# Patient Record
Sex: Male | Born: 1985 | Race: Black or African American | Hispanic: No | Marital: Single | Smoking: Current every day smoker
Health system: Southern US, Community
[De-identification: ages and names within clinical notes are randomized; demographics above are authoritative.]

---

## 2009-11-15 ENCOUNTER — Emergency Department: Payer: Self-pay | Admitting: Emergency Medicine
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
## 2012-08-28 HISTORY — PX: OTHER SURGICAL HISTORY: SHX169

## 2014-06-01 ENCOUNTER — Emergency Department: Payer: Self-pay | Admitting: Emergency Medicine

## 2014-06-09 ENCOUNTER — Emergency Department: Payer: Self-pay | Admitting: Emergency Medicine

## 2014-06-09 LAB — COMPREHENSIVE METABOLIC PANEL
ALK PHOS: 76 U/L
Albumin: 3.5 g/dL (ref 3.4–5.0)
Anion Gap: 15 (ref 7–16)
BUN: 10 mg/dL (ref 7–18)
Bilirubin,Total: 0.2 mg/dL (ref 0.2–1.0)
CALCIUM: 8.3 mg/dL — AB (ref 8.5–10.1)
CHLORIDE: 110 mmol/L — AB (ref 98–107)
Co2: 21 mmol/L (ref 21–32)
Creatinine: 1.29 mg/dL (ref 0.60–1.30)
EGFR (Non-African Amer.): >60
GLUCOSE: 143 mg/dL — AB (ref 65–99)
Osmolality: 292 (ref 275–301)
POTASSIUM: 3.9 mmol/L (ref 3.5–5.1)
SGOT(AST): 34 U/L (ref 15–37)
SGPT (ALT): 59 U/L
Sodium: 146 mmol/L — ABNORMAL HIGH (ref 136–145)
TOTAL PROTEIN: 6.5 g/dL (ref 6.4–8.2)

## 2014-06-09 LAB — URINALYSIS, COMPLETE
BACTERIA: NONE SEEN
Bilirubin,UR: NEGATIVE
Blood: NEGATIVE
GLUCOSE, UR: NEGATIVE mg/dL (ref 0–75)
Ketone: NEGATIVE
Leukocyte Esterase: NEGATIVE
NITRITE: NEGATIVE
PH: 5 (ref 4.5–8.0)
Protein: NEGATIVE
SQUAMOUS EPITHELIAL: NONE SEEN
Specific Gravity: 1.017 (ref 1.003–1.030)
WBC UR: 2 /HPF (ref 0–5)

## 2014-06-09 LAB — CBC
HCT: 42.9 % (ref 40.0–52.0)
HGB: 13.6 g/dL (ref 13.0–18.0)
MCH: 29.6 pg (ref 26.0–34.0)
MCHC: 31.7 g/dL — AB (ref 32.0–36.0)
MCV: 94 fL (ref 80–100)
Platelet: 242 10*3/uL (ref 150–440)
RBC: 4.59 10*6/uL (ref 4.40–5.90)
RDW: 13.5 % (ref 11.5–14.5)
WBC: 13.9 10*3/uL — AB (ref 3.8–10.6)

## 2014-06-09 LAB — ETHANOL
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
## 2016-01-11 ENCOUNTER — Emergency Department
Admission: EM | Admit: 2016-01-11 | Discharge: 2016-01-11 | Disposition: A | Discharge status: Home / Self Care | Payer: Medicaid Other | Attending: Emergency Medicine | Admitting: Emergency Medicine

## 2016-01-11 ENCOUNTER — Encounter: Payer: Self-pay | Admitting: Emergency Medicine

## 2016-01-11 DIAGNOSIS — R350 Frequency of micturition: Secondary | ICD-10-CM | POA: Insufficient documentation

## 2016-01-11 DIAGNOSIS — R030 Elevated blood-pressure reading, without diagnosis of hypertension: Secondary | ICD-10-CM | POA: Diagnosis not present

## 2016-01-11 LAB — URINALYSIS COMPLETE WITH MICROSCOPIC (ARMC ONLY)
BACTERIA UA: NONE SEEN
Bilirubin Urine: NEGATIVE
GLUCOSE, UA: NEGATIVE mg/dL
Hgb urine dipstick: NEGATIVE
Ketones, ur: NEGATIVE mg/dL
Leukocytes, UA: NEGATIVE
NITRITE: NEGATIVE
Protein, ur: NEGATIVE mg/dL
RBC / HPF: NONE SEEN RBC/hpf (ref 0–5)
Specific Gravity, Urine: 1.019 (ref 1.005–1.030)
pH: 6 (ref 5.0–8.0)

## 2016-01-11 NOTE — ED Notes (Signed)
States he developed some urinary freq abotu 1 month ago  Denies any penile discharge or flank pain

## 2016-01-11 NOTE — ED Provider Notes (Signed)
[facility] Emergency Department Provider Note  ____________________________________________  Time seen: Approximately 9:22 AM  I have reviewed the triage vital signs and the nursing notes.   HISTORY  Chief Complaint Urinary Frequency    HPI Copelan A Altier is a 30 y.o. male , NAD, presents to emergency room with one-month history of increased urinary frequency. Denies any urethral discharge, dysuria, hematuria, abdominal pain, back pain, nausea, vomiting. Has not had any fevers, chills, body aches. Notes that his girlfriend has had a recurrent UTIs over the last few weeks and currently has symptoms. States he drinks soda as his only intake of fluids. Has done so first many years. Has tried to increase water intake over the last few days to see no changes in urinary frequency. Denies headaches, chest pain, back pain.   History reviewed. No pertinent past medical history.  There are no active problems to display for this patient.   History reviewed. No pertinent past surgical history.  No current outpatient prescriptions on file.  Allergies Review of patient's allergies indicates no known allergies.  No family history on file.  Social History Social History  Substance Use Topics  . Smoking status: Never Smoker   . Smokeless tobacco: None  . Alcohol Use: None     Review of Systems  Constitutional: No fever/chills, fatigue Eyes: No visual changes. Cardiovascular: No chest pain. Respiratory: No shortness of breath.  Gastrointestinal: No abdominal pain.  No nausea, vomiting.   Genitourinary: Positive increased urinary frequency. Negative for dysuria, hematuria. No urethral discharge No urinary hesitancy, urgency. Musculoskeletal: Negative for back pain.  Skin: Negative for rash, skin sores. Neurological: Negative for headaches, focal weakness or numbness. No tingling 10-point ROS otherwise  negative.  ____________________________________________   PHYSICAL EXAM:  VITAL SIGNS: ED Triage Vitals  Enc Vitals Group     BP 01/11/16 0853 158/103 mmHg     Pulse Rate 01/11/16 0853 71     Resp 01/11/16 0853 18     Temp 01/11/16 0853 97.9 F (36.6 C)     Temp Source 01/11/16 0853 Oral     SpO2 01/11/16 0853 98 %     Weight 01/11/16 0853 220 lb (99.791 kg)     Height 01/11/16 0853 5\' 9"  (1.753 m)     Head Cir --      Peak Flow --      Pain Score --      Pain Loc --      Pain Edu? --      Excl. in [place]? --      Constitutional: Alert and oriented. Well appearing and in no acute distress. Eyes: Conjunctivae are normal. Head: Atraumatic. Cardiovascular: Normal rate, regular rhythm. Normal S1 and S2.  Good peripheral circulation. Respiratory: Normal respiratory effort without tachypnea or retractions. Lungs CTAB with breath sounds noted in all lung fields. Gastrointestinal: Soft and nontender, without distention or guarding in all quadrants. No CVA tenderness. Neurologic:  Normal speech and language.  Skin:  Large vertical scar noted about the abdomen from previous surgical removal of colon. Skin is warm, dry and intact. No rash noted. Psychiatric: Mood and affect are normal. Speech and behavior are normal. Patient exhibits appropriate insight and judgement.   ____________________________________________   LABS (all labs ordered are listed, but only abnormal results are displayed)  Labs Reviewed  URINALYSIS COMPLETEWITH MICROSCOPIC ([facility] ONLY) - Abnormal; Notable for the following:    Color, Urine YELLOW (*)    APPearance CLEAR (*)  Squamous Epithelial / LPF 0-5 (*)    All other components within normal limits   ____________________________________________  EKG  None ____________________________________________  RADIOLOGY  None ____________________________________________    PROCEDURES  Procedure(s) performed: None    Medications - No data to  display   ____________________________________________   INITIAL IMPRESSION / ASSESSMENT AND PLAN / ED COURSE  Pertinent lab results that were available during my care of the patient were reviewed by me and considered in my medical decision making (see chart for details).  Patient's diagnosis is consistent with increased urinary frequency and elevated blood pressure without diagnosis of hypertension. Physical exam, vital signs and urinalysis are reassuring and without evidence of infection or kidney distress. Patient was noted to have elevated blood pressure reading and was educated on hypertension and potential effects on the kidneys and urinary output. Patient advised to decrease soda intake and increase water intake. Patient is to follow up with [facility] clinic to establish care and for recheck if symptoms persist past this treatment course. Patient is given ED precautions to return to the ED for any worsening or new symptoms.    ____________________________________________  FINAL CLINICAL IMPRESSION(S) / ED DIAGNOSES  Final diagnoses:  Increased urinary frequency  Elevated blood pressure reading without diagnosis of hypertension      NEW MEDICATIONS STARTED DURING THIS VISIT:  New Prescriptions   No medications on file         Braxton Feathers, PA-C 01/11/16 [id number]

## 2016-01-11 NOTE — ED Notes (Signed)
C/o urinary frequency.  States his girlfriend has a UTI.  NAD

## 2016-01-11 NOTE — Discharge Instructions (Signed)
Please limit intake of sodas and increase intake of water.   Urinary Frequency The number of times a normal person urinates depends upon how much liquid they take in and how much liquid they are losing. If the temperature is hot and there is high humidity, then the person will sweat more and usually breathe a little more frequently. These factors decrease the amount of frequency of urination that would be considered normal. The amount you drink is easily determined, but the amount of fluid lost is sometimes more difficult to calculate.  Fluid is lost in two ways:  Sensible fluid loss is usually measured by the amount of urine that you get rid of. Losses of fluid can also occur with diarrhea.  Insensible fluid loss is more difficult to measure. It is caused by evaporation. Insensible loss of fluid occurs through breathing and sweating. It usually ranges from a little less than a quart to a little more than a quart of fluid a day. In normal temperatures and activity levels, the average person may urinate 4 to 7 times in a 24-hour period. Needing to urinate more often than that could indicate a problem. If one urinates 4 to 7 times in 24 hours and has large volumes each time, that could indicate a different problem from one who urinates 4 to 7 times a day and has small volumes. The time of urinating is also important. Most urinating should be done during the waking hours. Getting up at night to urinate frequently can indicate some problems. CAUSES  The bladder is the organ in your lower abdomen that holds urine. Like a balloon, it swells some as it fills up. Your nerves sense this and tell you it is time to head for the bathroom. There are a number of reasons that you might feel the need to urinate more often than usual. They include:  Urinary tract infection. This is usually associated with other signs such as burning when you urinate.  In men, problems with the prostate (a walnut-size gland that is  located near the tube that carries urine out of your body). There are two reasons why the prostate can cause an increased frequency of urination:  An enlarged prostate that does not let the bladder empty well. If the bladder only half empties when you urinate, then it only has half the capacity to fill before you have to urinate again.  The nerves in the bladder become more hypersensitive with an increased size of the prostate even if the bladder empties completely.  Pregnancy.  Obesity. Excess weight is more likely to cause a problem for women than for men.  Bladder stones or other bladder problems.  Caffeine.  Alcohol.  Medications. For example, drugs that help the body get rid of extra fluid (diuretics) increase urine production. Some other medicines must be taken with lots of fluids.  Muscle or nerve weakness. This might be the result of a spinal cord injury, a stroke, multiple sclerosis, or Parkinson disease.  Long-standing diabetes can decrease the sensation of the bladder. This loss of sensation makes it harder to sense the bladder needs to be emptied. Over a period of years, the bladder is stretched out by constant overfilling. This weakens the bladder muscles so that the bladder does not empty well and has less capacity to fill with new urine.  Interstitial cystitis (also called painful bladder syndrome). This condition develops because the tissues that line the inside of the bladder are inflamed (inflammation is the body's  way of reacting to injury or infection). It causes pain and frequent urination. It occurs in women more often than in men. DIAGNOSIS   To decide what might be causing your urinary frequency, your health care provider will probably:  Ask about symptoms you have noticed.  Ask about your overall health. This will include questions about any medications you are taking.  Do a physical examination.  Order some tests. These might include:  A blood test to  check for diabetes or other health issues that could be contributing to the problem.  Urine testing. This could measure the flow of urine and the pressure on the bladder.  A test of your neurological system (the brain, spinal cord, and nerves). This is the system that senses the need to urinate.  A bladder test to check whether it is emptying completely when you urinate.  Cystoscopy. This test uses a thin tube with a tiny camera on it. It offers a look inside your urethra and bladder to see if there are problems.  Imaging tests. You might be given a contrast dye and then asked to urinate. X-rays are taken to see how your bladder is working. TREATMENT  It is important for you to be evaluated to determine if the amount or frequency that you have is unusual or abnormal. If it is found to be abnormal, the cause should be determined and this can usually be found out easily. Depending upon the cause, treatment could include medication, stimulation of the nerves, or surgery. There are not too many things that you can do as an individual to change your urinary frequency. It is important that you balance the amount of fluid intake needed to compensate for your activity and the temperature. Medical problems will be diagnosed and taken care of by your physician. There is no particular bladder training such as Kegel exercises that you can do to help urinary frequency. This is an exercise that is usually recommended for people who have leaking of urine when they laugh, cough, or sneeze. HOME CARE INSTRUCTIONS   Take any medications your health care provider prescribed or suggested. Follow the directions carefully.  Practice any lifestyle changes that are recommended. These might include:  Drinking less fluid or drinking at different times of the day. If you need to urinate often during the night, for example, you may need to stop drinking fluids early in the evening.  Cutting down on caffeine or alcohol.  They both can make you need to urinate more often than normal. Caffeine is found in coffee, tea, and sodas.  Losing weight, if that is recommended.  Keep a journal or a log. You might be asked to record how much you drink and when and where you feel the need to urinate. This will also help evaluate how well the treatment provided by your physician is working. SEEK MEDICAL CARE IF:   Your need to urinate often gets worse.  You feel increased pain or irritation when you urinate.  You notice blood in your urine.  You have questions about any medications that your health care provider recommended.  You notice blood, pus, or swelling at the site of any test or treatment procedure.  You develop a fever of more than 100.62F (38.1C). SEEK IMMEDIATE MEDICAL CARE IF:  You develop a fever of more than 102.79F (38.9C).   This information is not intended to replace advice given to you by your health care provider. Make sure you discuss any questions you have  with your health care provider.   Document Released: 06/10/2009 Document Revised: 09/04/2014 Document Reviewed: 06/10/2009 Elsevier Interactive Patient Education 2016 [place] DASH stands for "Dietary Approaches to Stop Hypertension." The DASH eating plan is a healthy eating plan that has been shown to reduce high blood pressure (hypertension). Additional health benefits may include reducing the risk of type 2 diabetes mellitus, heart disease, and stroke. The DASH eating plan may also help with weight loss. WHAT DO I NEED TO KNOW ABOUT THE DASH EATING PLAN? For the DASH eating plan, you will follow these general guidelines:  Choose foods with a percent daily value for sodium of less than 5% (as listed on the food label).  Use salt-free seasonings or herbs instead of table salt or sea salt.  Check with your health care provider or pharmacist before using salt substitutes.  Eat lower-sodium products, often labeled  as "lower sodium" or "no salt added."  Eat fresh foods.  Eat more vegetables, fruits, and low-fat dairy products.  Choose whole grains. Look for the word "whole" as the first word in the ingredient list.  Choose fish and skinless chicken or [place] more often than red meat. Limit fish, poultry, and meat to 6 oz (170 g) each day.  Limit sweets, desserts, sugars, and sugary drinks.  Choose heart-healthy fats.  Limit cheese to 1 oz (28 g) per day.  Eat more home-cooked food and less restaurant, buffet, and fast food.  Limit fried foods.  Cook foods using methods other than frying.  Limit canned vegetables. If you do use them, rinse them well to decrease the sodium.  When eating at a restaurant, ask that your food be prepared with less salt, or no salt if possible. WHAT FOODS CAN I EAT? Seek help from a dietitian for individual calorie needs. Grains Whole grain or whole wheat bread. Brown rice. Whole grain or whole wheat pasta. Quinoa, bulgur, and whole grain cereals. Low-sodium cereals. Corn or whole wheat flour tortillas. Whole grain cornbread. Whole grain crackers. Low-sodium crackers. Vegetables Fresh or frozen vegetables (raw, steamed, roasted, or grilled). Low-sodium or reduced-sodium tomato and vegetable juices. Low-sodium or reduced-sodium tomato sauce and paste. Low-sodium or reduced-sodium canned vegetables.  Fruits All fresh, canned (in natural juice), or frozen fruits. Meat and Other Protein Products Ground beef (85% or leaner), grass-fed beef, or beef trimmed of fat. Skinless chicken or [place]. Ground chicken or [place]. Pork trimmed of fat. All fish and seafood. Eggs. Dried beans, peas, or lentils. Unsalted nuts and seeds. Unsalted canned beans. Dairy Low-fat dairy products, such as skim or 1% milk, 2% or reduced-fat cheeses, low-fat ricotta or cottage cheese, or plain low-fat yogurt. Low-sodium or reduced-sodium cheeses. Fats and Oils Tub margarines without trans fats.  Light or reduced-fat mayonnaise and salad dressings (reduced sodium). Avocado. Safflower, olive, or canola oils. Natural peanut or almond butter. Other Unsalted popcorn and pretzels. The items listed above may not be a complete list of recommended foods or beverages. Contact your dietitian for more options. WHAT FOODS ARE NOT RECOMMENDED? Grains White bread. White pasta. White rice. Refined cornbread. Bagels and croissants. Crackers that contain trans fat. Vegetables Creamed or fried vegetables. Vegetables in a cheese sauce. Regular canned vegetables. Regular canned tomato sauce and paste. Regular tomato and vegetable juices. Fruits Dried fruits. Canned fruit in light or heavy syrup. Fruit juice. Meat and Other Protein Products Fatty cuts of meat. Ribs, chicken wings, bacon, sausage, bologna, salami, chitterlings, fatback, hot dogs, bratwurst, and packaged  luncheon meats. Salted nuts and seeds. Canned beans with salt. Dairy Whole or 2% milk, cream, half-and-half, and cream cheese. Whole-fat or sweetened yogurt. Full-fat cheeses or blue cheese. Nondairy creamers and whipped toppings. Processed cheese, cheese spreads, or cheese curds. Condiments Onion and garlic salt, seasoned salt, table salt, and sea salt. Canned and packaged gravies. Worcestershire sauce. Tartar sauce. Barbecue sauce. Teriyaki sauce. Soy sauce, including reduced sodium. Steak sauce. Fish sauce. Oyster sauce. Cocktail sauce. Horseradish. Ketchup and mustard. Meat flavorings and tenderizers. Bouillon cubes. Hot sauce. Tabasco sauce. Marinades. Taco seasonings. Relishes. Fats and Oils Butter, stick margarine, lard, shortening, ghee, and bacon fat. Coconut, palm kernel, or palm oils. Regular salad dressings. Other Pickles and olives. Salted popcorn and pretzels. The items listed above may not be a complete list of foods and beverages to avoid. Contact your dietitian for more information. WHERE CAN I FIND MORE  INFORMATION? National Heart, Lung, and Blood Institute: travelstabloid.com   This information is not intended to replace advice given to you by your health care provider. Make sure you discuss any questions you have with your health care provider.   Document Released: 08/03/2011 Document Revised: 09/04/2014 Document Reviewed: 06/18/2013 Elsevier Interactive Patient Education 2016 [employer].  Hypertension Hypertension is another name for high blood pressure. High blood pressure forces your heart to work harder to pump blood. A blood pressure reading has two numbers, which includes a higher number over a lower number (example: 110/72). HOME CARE   Have your blood pressure rechecked by your doctor.  Only take medicine as told by your doctor. Follow the directions carefully. The medicine does not work as well if you skip doses. Skipping doses also puts you at risk for problems.  Do not smoke.  Monitor your blood pressure at home as told by your doctor. GET HELP IF:  You think you are having a reaction to the medicine you are taking.  You have repeat headaches or feel dizzy.  You have puffiness (swelling) in your ankles.  You have trouble with your vision. GET HELP RIGHT AWAY IF:   You get a very bad headache and are confused.  You feel weak, numb, or faint.  You get chest or belly (abdominal) pain.  You throw up (vomit).  You cannot breathe very well. MAKE SURE YOU:   Understand these instructions.  Will watch your condition.  Will get help right away if you are not doing well or get worse.   This information is not intended to replace advice given to you by your health care provider. Make sure you discuss any questions you have with your health care provider.   Document Released: 01/31/2008 Document Revised: 08/19/2013 Document Reviewed: 06/06/2013 Elsevier Interactive Patient Education [employer].

## 2018-10-14 ENCOUNTER — Emergency Department
Admission: EM | Admit: 2018-10-14 | Discharge: 2018-10-14 | Disposition: A | Discharge status: Home / Self Care | Payer: Medicaid Other | Attending: Emergency Medicine | Admitting: Emergency Medicine

## 2018-10-14 ENCOUNTER — Other Ambulatory Visit: Payer: Self-pay

## 2018-10-14 ENCOUNTER — Encounter: Payer: Self-pay | Admitting: Emergency Medicine

## 2018-10-14 DIAGNOSIS — D170 Benign lipomatous neoplasm of skin and subcutaneous tissue of head, face and neck: Secondary | ICD-10-CM | POA: Diagnosis not present

## 2018-10-14 DIAGNOSIS — G501 Atypical facial pain: Secondary | ICD-10-CM | POA: Diagnosis present

## 2018-10-14 NOTE — ED Provider Notes (Signed)
[facility] Emergency Department Provider Note  ____________________________________________  Time seen: Approximately 3:18 PM  I have reviewed the triage vital signs and the nursing notes.   HISTORY  Chief Complaint [place]    HPI Aaron Montgomery is a 33 y.o. male who presents the emergency department complaining of a "lump" to the left face.  Patient reports that he had a lesion consistent with a "whitehead" approximately 3 to 4 months ago.  Patient reports that it never came to a "head" or drained any material.  Patient has then been able to palpate a lesion to this area that is been slowly enlarging over the past 3 months.  Patient denies any overlying skin changes.  No drainage.  Patient reports that now area is becoming aggravating especially while sleeping on that side.  No other complaints at this time.  No history of recurrent skin lesions.    History reviewed. No pertinent past medical history.  There are no active problems to display for this patient.   No past surgical history on file.  Prior to Admission medications   Not on File    Allergies Patient has no known allergies.  No family history on file.  Social History Social History   Tobacco Use  . Smoking status: Never Smoker  Substance Use Topics  . Alcohol use: Not on file  . Drug use: Not on file     Review of Systems  Constitutional: No fever/chills Eyes: No visual changes. No discharge ENT: No upper respiratory complaints. Cardiovascular: no chest pain. Respiratory: no cough. No SOB. Gastrointestinal: No abdominal pain.  No nausea, no vomiting.  No diarrhea.  No constipation. Musculoskeletal: Negative for musculoskeletal pain. Skin: Positive for "lump" to the left face Neurological: Negative for headaches, focal weakness or numbness. 10-point ROS otherwise negative.  ____________________________________________   PHYSICAL EXAM:  VITAL SIGNS: ED Triage Vitals  Enc  Vitals Group     BP 10/14/18 1412 (!) 164/106     Pulse Rate 10/14/18 1412 67     Resp 10/14/18 1412 20     Temp 10/14/18 1412 97.6 F (36.4 C)     Temp Source 10/14/18 1412 Oral     SpO2 10/14/18 1412 97 %     Weight 10/14/18 1413 218 lb (98.9 kg)     Height 10/14/18 1413 5\' 9"  (1.753 m)     Head Circumference --      Peak Flow --      Pain Score 10/14/18 1413 7     Pain Loc --      Pain Edu? --      Excl. in [place]? --      Constitutional: Alert and oriented. Well appearing and in no acute distress. Eyes: Conjunctivae are normal. PERRL. EOMI. Head: Atraumatic.  Visualization of the left face reveals edematous lesion identified to the left [place].  No overlying erythema.  No drainage.  On palpation, patient has a well-circumscribed, mobile lesion underlying the skin.  This is consistent with epidermal cyst/lipoma.  No evidence of overlying cellulitic changes. ENT:      Ears:       Nose: No congestion/rhinnorhea.      Mouth/Throat: Mucous membranes are moist.  No significant intraoral findings. Neck: No stridor.   Hematological/Lymphatic/Immunilogical: No cervical lymphadenopathy. Cardiovascular: Normal rate, regular rhythm. Normal S1 and S2.  Good peripheral circulation. Respiratory: Normal respiratory effort without tachypnea or retractions. Lungs CTAB. Good air entry to the bases with no decreased or absent  breath sounds. Musculoskeletal: Full range of motion to all extremities. No gross deformities appreciated. Neurologic:  Normal speech and language. No gross focal neurologic deficits are appreciated.  Skin:  Skin is warm, dry and intact. No rash noted. Psychiatric: Mood and affect are normal. Speech and behavior are normal. Patient exhibits appropriate insight and judgement.   ____________________________________________   LABS (all labs ordered are listed, but only abnormal results are displayed)  Labs Reviewed - No data to  display ____________________________________________  EKG   ____________________________________________  RADIOLOGY   No results found.  ____________________________________________    PROCEDURES  Procedure(s) performed:    Procedures    Medications - No data to display   ____________________________________________   INITIAL IMPRESSION / ASSESSMENT AND PLAN / ED COURSE  Pertinent labs & imaging results that were available during my care of the patient were reviewed by me and considered in my medical decision making (see chart for details).  Review of the [place] CSRS was performed in accordance of the [place] prior to dispensing any controlled drugs.      Patient's diagnosis is consistent with lipoma face.  Patient presents emergency department with a lesion to the left face that is been slowly enlarging over the past couple of months.  No evidence of infection.  Area is consistent with a lipoma.  Patient is advised to follow-up with surgery for excision of same.  No medications at this time..  Patient is given ED precautions to return to the ED for any worsening or new symptoms.     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 Despite best efforts to proofread, errors can occur which can change the meaning. Any change was purely unintentional.    Darletta Moll, PA-C 10/14/18 1528    Schuyler Amor, MD 10/14/18 [id number]

## 2018-10-14 NOTE — ED Triage Notes (Signed)
Lump L face x 2 months.

## 2018-10-14 NOTE — ED Notes (Signed)
See triage note Presents with a possible abscess at face

## 2018-10-29 ENCOUNTER — Ambulatory Visit (INDEPENDENT_AMBULATORY_CARE_PROVIDER_SITE_OTHER): Payer: Medicaid Other | Admitting: Surgery

## 2018-10-29 ENCOUNTER — Encounter: Payer: Self-pay | Admitting: Surgery

## 2018-10-29 ENCOUNTER — Other Ambulatory Visit: Payer: Self-pay

## 2018-10-29 VITALS — BP 153/101 | HR 83 | Temp 99.0°F | Ht 74.0 in | Wt 229.0 lb

## 2018-10-29 DIAGNOSIS — L723 Sebaceous cyst: Secondary | ICD-10-CM

## 2018-10-29 NOTE — Progress Notes (Signed)
Surgical Clinic History and Physical  Referring provider:  No referring provider defined for this encounter.  HISTORY OF PRESENT ILLNESS (HPI):  33 y.o. male presents for evaluation of his Left face mass. Patient reports he first noticed it 4 months ago (this past December), at which time he describes it appeared to be a "big pimple". Over the next month, it continued become enlarged and has recently started to become increasingly painful, for which he 2 weeks ago presented to [facility] ED for evaluation and was referred to our office for further management. Patient denies any surrounding redness or drainage and expresses he'd like to have it removed. He otherwise denies fever/chills. He also denies any similar lesions elsewhere on his body, symptomatic or otherwise.  PAST MEDICAL HISTORY (PMH):  No past medical history on file.   PAST SURGICAL HISTORY ([place]):  Past Surgical History:  Procedure Laterality Date  . colon recection  2014    MEDICATIONS:  Prior to Admission medications   Not on File    ALLERGIES:  No Known Allergies   SOCIAL HISTORY:  Social History   Socioeconomic History  . Marital status: Single    Spouse name: Not on file  . Number of children: Not on file  . Years of education: Not on file  . Highest education level: Not on file  Occupational History  . Not on file  Social Needs  . Financial resource strain: Not on file  . Food insecurity:    Worry: Not on file    Inability: Not on file  . Transportation needs:    Medical: Not on file    Non-medical: Not on file  Tobacco Use  . Smoking status: Current Every Day Smoker  . Smokeless tobacco: Never Used  Substance and Sexual Activity  . Alcohol use: Yes  . Drug use: Never  . Sexual activity: Not on file  Lifestyle  . Physical activity:    Days per week: Not on file    Minutes per session: Not on file  . Stress: Not on file  Relationships  . Social connections:    Talks on phone: Not on file   Gets together: Not on file    Attends religious service: Not on file    Active member of club or organization: Not on file    Attends meetings of clubs or organizations: Not on file    Relationship status: Not on file  . Intimate partner violence:    Fear of current or ex partner: Not on file    Emotionally abused: Not on file    Physically abused: Not on file    Forced sexual activity: Not on file  Other Topics Concern  . Not on file  Social History Narrative  . Not on file    The patient currently resides (home / rehab facility / nursing home): Home The patient normally is (ambulatory / bedbound): Ambulatory  FAMILY HISTORY:  No family history on file.  Otherwise negative/non-contributory.  REVIEW OF SYSTEMS:  Constitutional: denies any other weight loss, fever, chills, or sweats  Eyes: denies any other vision changes, history of eye injury  ENT: denies sore throat, hearing problems  Respiratory: denies shortness of breath, wheezing  Cardiovascular: denies chest pain, palpitations  Gastrointestinal: denies abdominal pain, N/V, or diarrhea Musculoskeletal: denies any other joint pains or cramps  Skin: Denies any other rashes or skin discolorations except as per HPI Neurological: denies any other headache, dizziness, weakness  Psychiatric: Denies any other depression, anxiety  All other review of systems were otherwise negative   VITAL SIGNS:  BP (!) 153/101   Pulse 83   Temp 99 F (37.2 C) (Skin)   Ht 6\' 2"  (1.88 m)   Wt 229 lb (103.9 kg)   SpO2 98%   BMI 29.40 kg/m   PHYSICAL EXAM:  Constitutional:  -- Normal body habitus  -- Awake, alert, and oriented x3  Eyes:  -- Pupils equally round and reactive to light  -- No scleral icterus  Ear, nose, throat:  -- No jugular venous distension -- No nasal drainage, bleeding Pulmonary:  -- No crackles  -- Equal breath sounds bilaterally -- Breathing non-labored at rest Cardiovascular:  -- S1, S2 present  -- No  pericardial rubs  Gastrointestinal:  -- Abdomen soft, non-tender to palpation, non-distended, no guarding/rebound tenderness -- No abdominal masses appreciated, pulsatile or otherwise  Musculoskeletal and Integumentary:  -- Wounds or skin discoloration: 1.5 cm Left upper Rossie round, firm, and mobile mildly tender to palpation subcutaneous mass without surrounding erythema or drainage, most likely consistent with a sebaceous cyst -- Extremities: B/L UE and LE FROM, hands and feet warm, no edema  Neurologic:  -- Motor function: Intact and symmetric -- Sensation: Intact and symmetric  Labs:  CBC Latest Ref Rng & Units 06/09/2014  WBC 3.8 - 10.6 x10 3/mm 3 13.9(H)  Hemoglobin 13.0 - 18.0 g/dL 13.6  Hematocrit 40.0 - 52.0 % 42.9  Platelets 150 - 440 x10 3/mm 3 242   CMP Latest Ref Rng & Units 06/09/2014  Glucose 65 - 99 mg/dL 143(H)  BUN 7 - 18 mg/dL 10  Creatinine 0.60 - 1.30 mg/dL 1.29  Sodium 136 - 145 mmol/L 146(H)  Potassium 3.5 - 5.1 mmol/L 3.9  Chloride 98 - 107 mmol/L 110(H)  CO2 21 - 32 mmol/L 21  Calcium 8.5 - 10.1 mg/dL 8.3(L)  Total Protein 6.4 - 8.2 g/dL 6.5  Total Bilirubin 0.2 - 1.0 mg/dL 0.2  Alkaline Phos Unit/L 76  AST 15 - 37 Unit/L 34  ALT U/L 59   Imaging studies: No new pertinent imaging studies available for review at this time   Assessment/Plan:  33 y.o. male with an enlarged and increasingly symptomatic (painful) Left Bojanowski subcutaneous mass (most likely consistent with a sebaceous cyst), complicated by co-morbidities including chronic ongoing tobacco abuse (smoking).   - differential diagnoses discussed   - also discussed natural history of sebaceous cysts in general and indications for excision  - considering prominent facial location of cyst, recommend referral to plastic surgery (Dr. Audelia Hives) for evaluation and consideration for excision  - return to clinic as needed, instructed to call if any questions or concerns  All of the above  recommendations were discussed with the patient, and all of patient's questions were answered to his expressed satisfaction.  Thank you for the opportunity to participate in this patient's care.  -- Marilynne Drivers Rosana Hoes, MD, [place]: [facility] - Partnering for exceptional care. Office: [phone]

## 2018-10-29 NOTE — Patient Instructions (Addendum)
Ref to plastic surgery sebaceous cyst on face.

## 2018-12-13 ENCOUNTER — Institutional Professional Consult (permissible substitution): Payer: Medicaid Other | Admitting: Plastic Surgery
